# Patient Record
Sex: Male | Born: 1935 | Race: White | Hispanic: No | Marital: Single
Health system: Southern US, Community
[De-identification: ages and names within clinical notes are randomized; demographics above are authoritative.]

## PROBLEM LIST (undated history)

## (undated) DIAGNOSIS — I10 Essential (primary) hypertension: Secondary | ICD-10-CM

## (undated) DIAGNOSIS — N400 Enlarged prostate without lower urinary tract symptoms: Secondary | ICD-10-CM

## (undated) DIAGNOSIS — M1990 Unspecified osteoarthritis, unspecified site: Secondary | ICD-10-CM

## (undated) DIAGNOSIS — G20 Parkinson's disease: Secondary | ICD-10-CM

## (undated) DIAGNOSIS — F32A Depression, unspecified: Secondary | ICD-10-CM

## (undated) DIAGNOSIS — E039 Hypothyroidism, unspecified: Secondary | ICD-10-CM

## (undated) DIAGNOSIS — K219 Gastro-esophageal reflux disease without esophagitis: Secondary | ICD-10-CM

## (undated) DIAGNOSIS — F0390 Unspecified dementia without behavioral disturbance: Secondary | ICD-10-CM

## (undated) DIAGNOSIS — F329 Major depressive disorder, single episode, unspecified: Secondary | ICD-10-CM

## (undated) DIAGNOSIS — I509 Heart failure, unspecified: Secondary | ICD-10-CM

## (undated) DIAGNOSIS — J449 Chronic obstructive pulmonary disease, unspecified: Secondary | ICD-10-CM

---

## 2007-05-29 ENCOUNTER — Other Ambulatory Visit: Payer: Self-pay

## 2007-05-29 ENCOUNTER — Inpatient Hospital Stay: Payer: Self-pay | Admitting: Surgery

## 2007-09-04 ENCOUNTER — Other Ambulatory Visit: Payer: Self-pay

## 2007-09-04 ENCOUNTER — Ambulatory Visit: Payer: Self-pay | Admitting: Surgery

## 2007-09-11 ENCOUNTER — Ambulatory Visit: Payer: Self-pay | Admitting: Surgery

## 2007-09-12 ENCOUNTER — Inpatient Hospital Stay: Payer: Self-pay | Admitting: Surgery

## 2008-01-22 ENCOUNTER — Ambulatory Visit: Payer: Self-pay | Admitting: Internal Medicine

## 2008-12-27 IMAGING — CR PELVIS - 1-2 VIEW
1 series · 1 of 1 positions shown · non-contrast
Comparison: none

REASON FOR EXAM: Fall
COMMENTS:

PROCEDURE:     DXR - DXR PELVIS AP ONLY  - May 29, 2007  [DATE]
RESULT:     There is marked distention of visualized bowel loops. There are
findings that could reflect a sigmoid volvulus. I do not see acute bony
abnormalities.

[view not recorded]
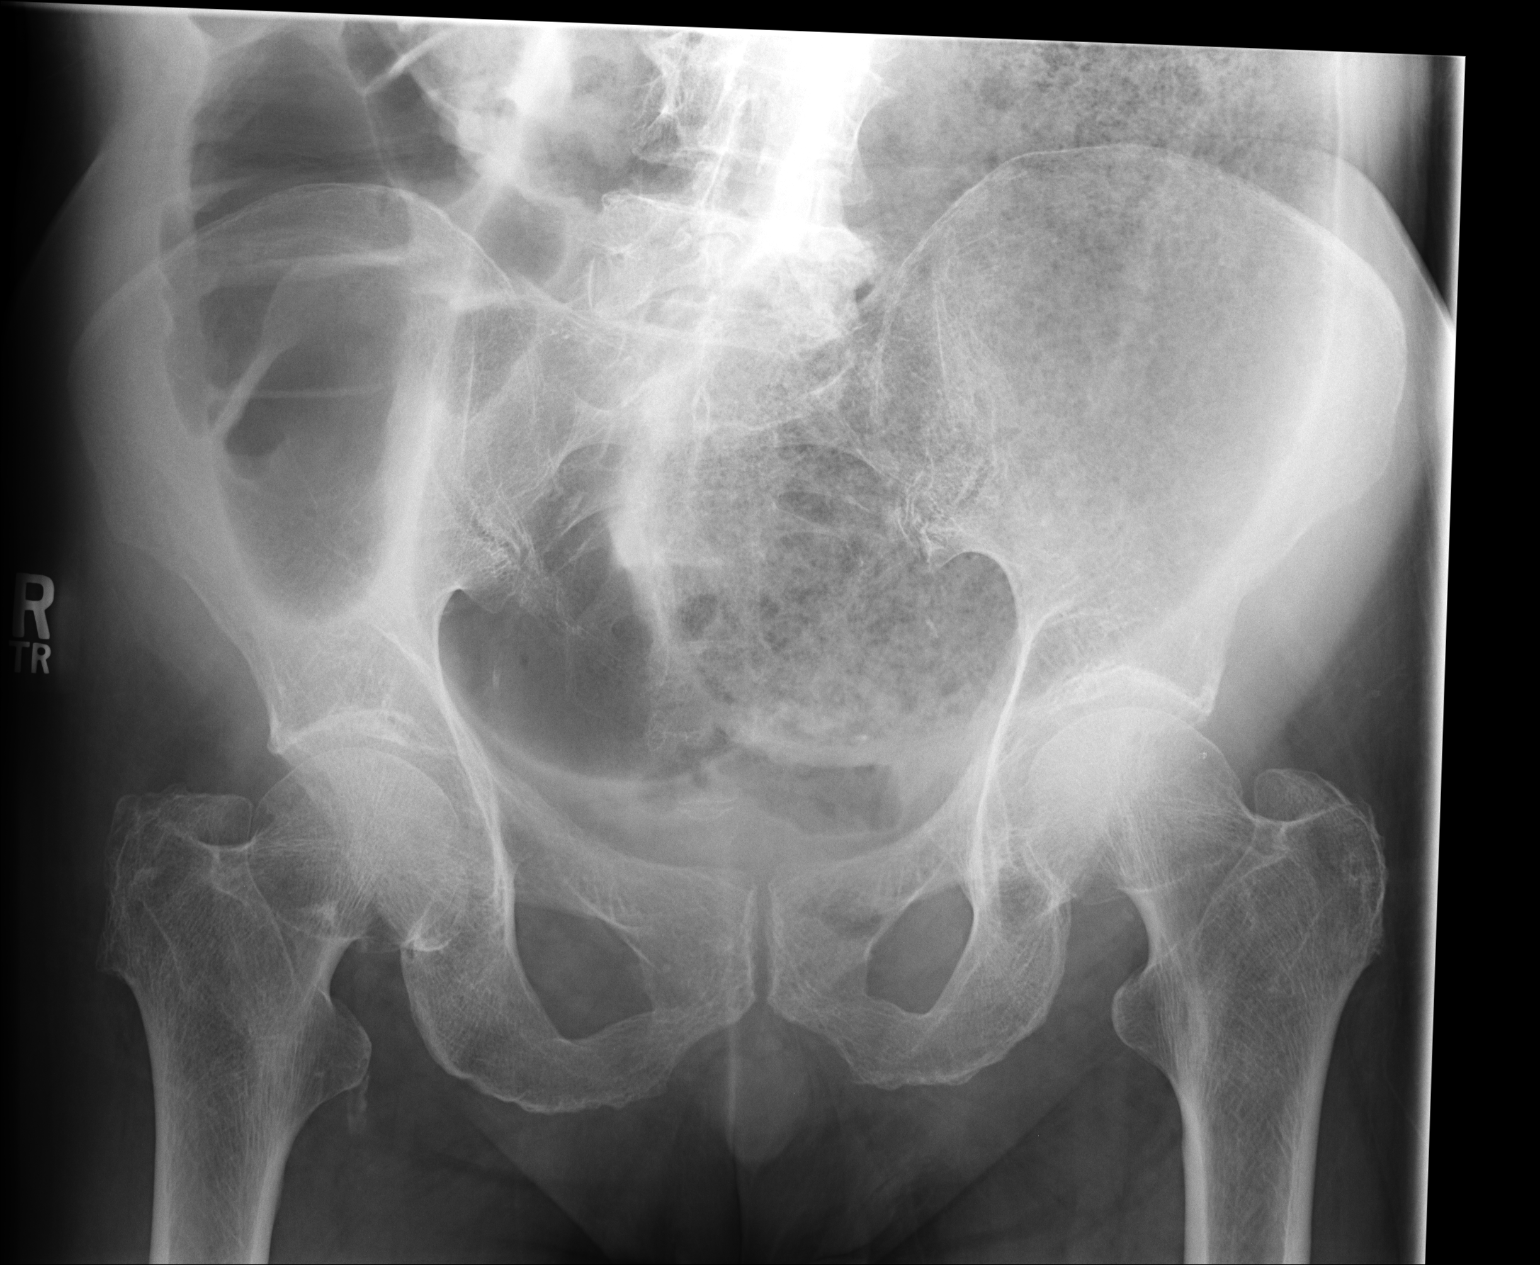

[1 of 1 positions shown; findings below may reference images not displayed]

IMPRESSION: I do not see objective evidence of a fracture of the bony
pelvis. A large amount of distended bowel is present in the pelvis. The
patient has undergone recent CT scanning.

## 2009-07-22 ENCOUNTER — Ambulatory Visit: Payer: Self-pay | Admitting: Unknown Physician Specialty

## 2009-07-30 ENCOUNTER — Emergency Department: Payer: Self-pay | Admitting: Unknown Physician Specialty

## 2009-08-01 ENCOUNTER — Emergency Department: Payer: Self-pay | Admitting: Unknown Physician Specialty

## 2009-10-12 ENCOUNTER — Inpatient Hospital Stay: Payer: Self-pay | Admitting: Internal Medicine

## 2009-12-26 ENCOUNTER — Observation Stay: Payer: Self-pay | Admitting: Internal Medicine

## 2009-12-27 ENCOUNTER — Ambulatory Visit: Payer: Self-pay | Admitting: Geriatric Medicine

## 2009-12-31 ENCOUNTER — Inpatient Hospital Stay: Payer: Self-pay | Admitting: Internal Medicine

## 2010-03-13 ENCOUNTER — Emergency Department: Payer: Self-pay | Admitting: Emergency Medicine

## 2010-03-16 ENCOUNTER — Inpatient Hospital Stay: Payer: Self-pay | Admitting: Specialist

## 2010-10-17 ENCOUNTER — Emergency Department: Payer: Self-pay | Admitting: Emergency Medicine

## 2010-12-01 ENCOUNTER — Ambulatory Visit: Payer: Self-pay | Admitting: Geriatric Medicine

## 2010-12-06 ENCOUNTER — Other Ambulatory Visit: Payer: Self-pay | Admitting: Geriatric Medicine

## 2011-05-16 IMAGING — CR DG ABDOMEN 2V
1 series · 2 of 2 positions shown · non-contrast
Comparison: none

REASON FOR EXAM: ileus
COMMENTS:   Bedside (portable):Y

[Series 1: view not recorded · 0.17mm/px · 2 of 2 slices shown]
[im 1/2]
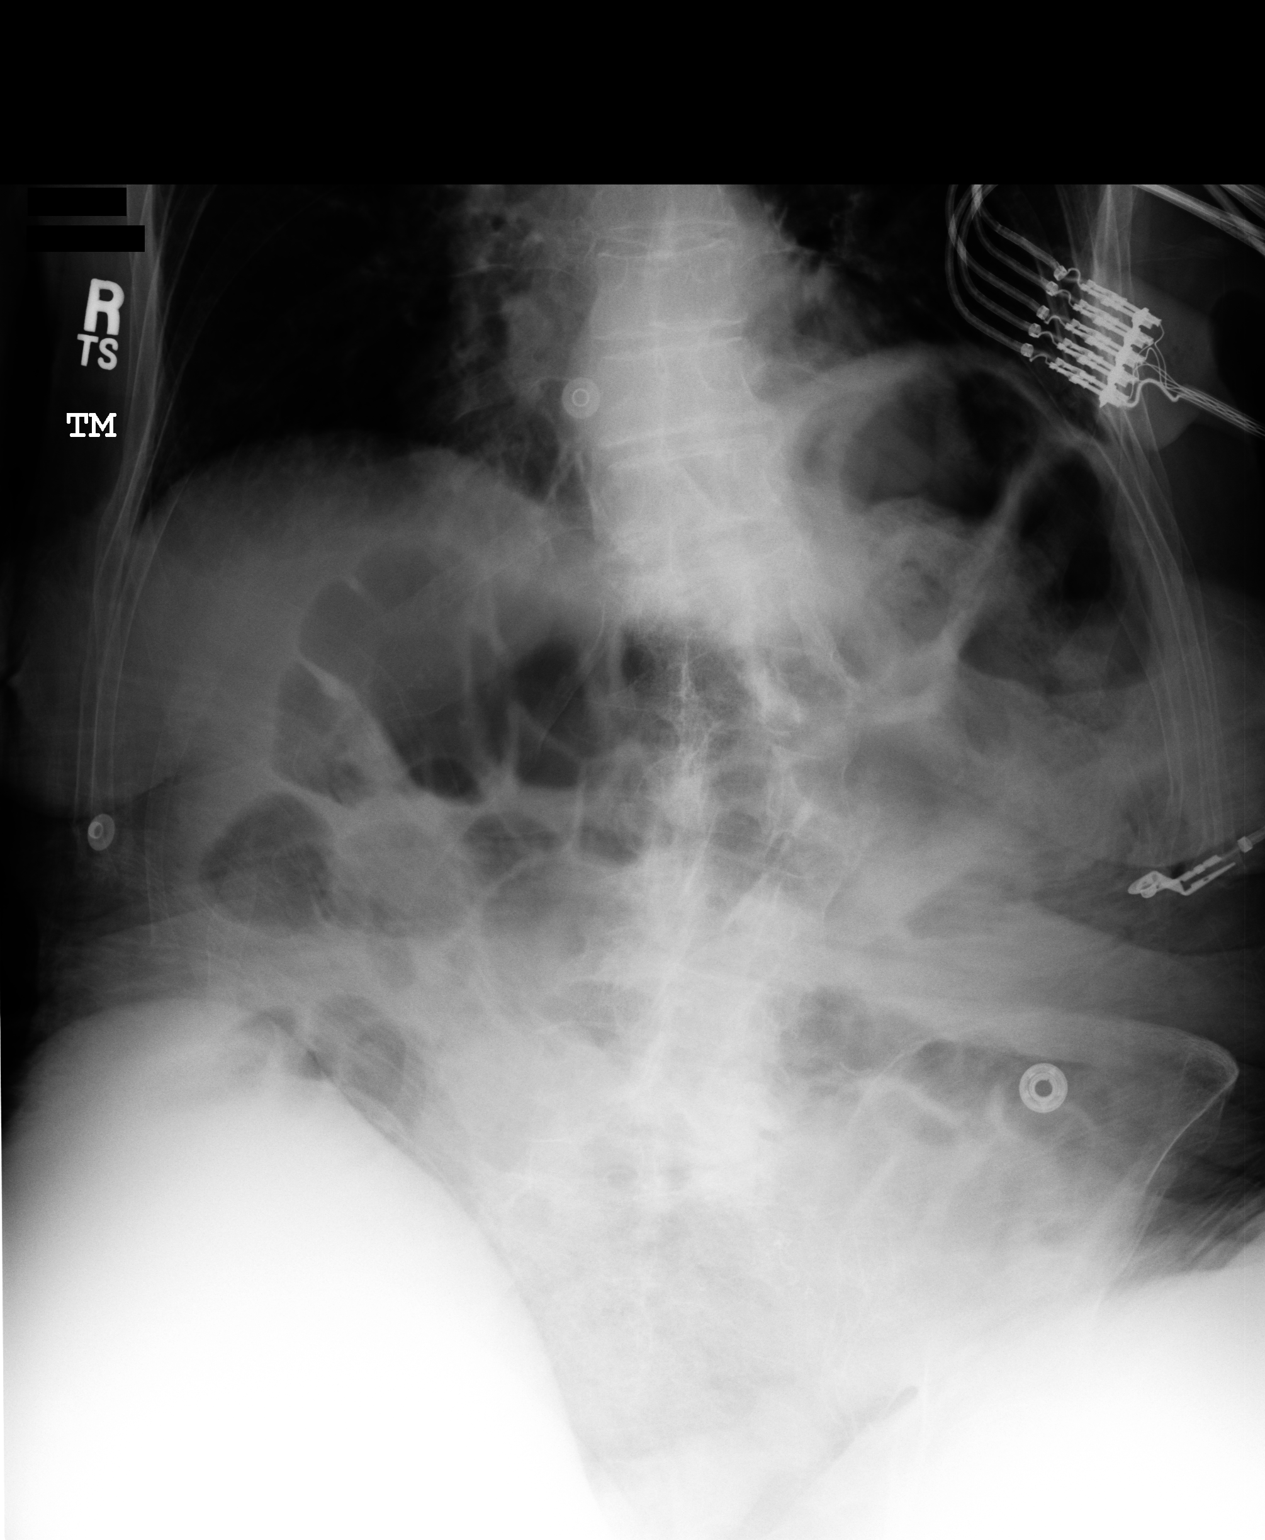
[im 2/2]
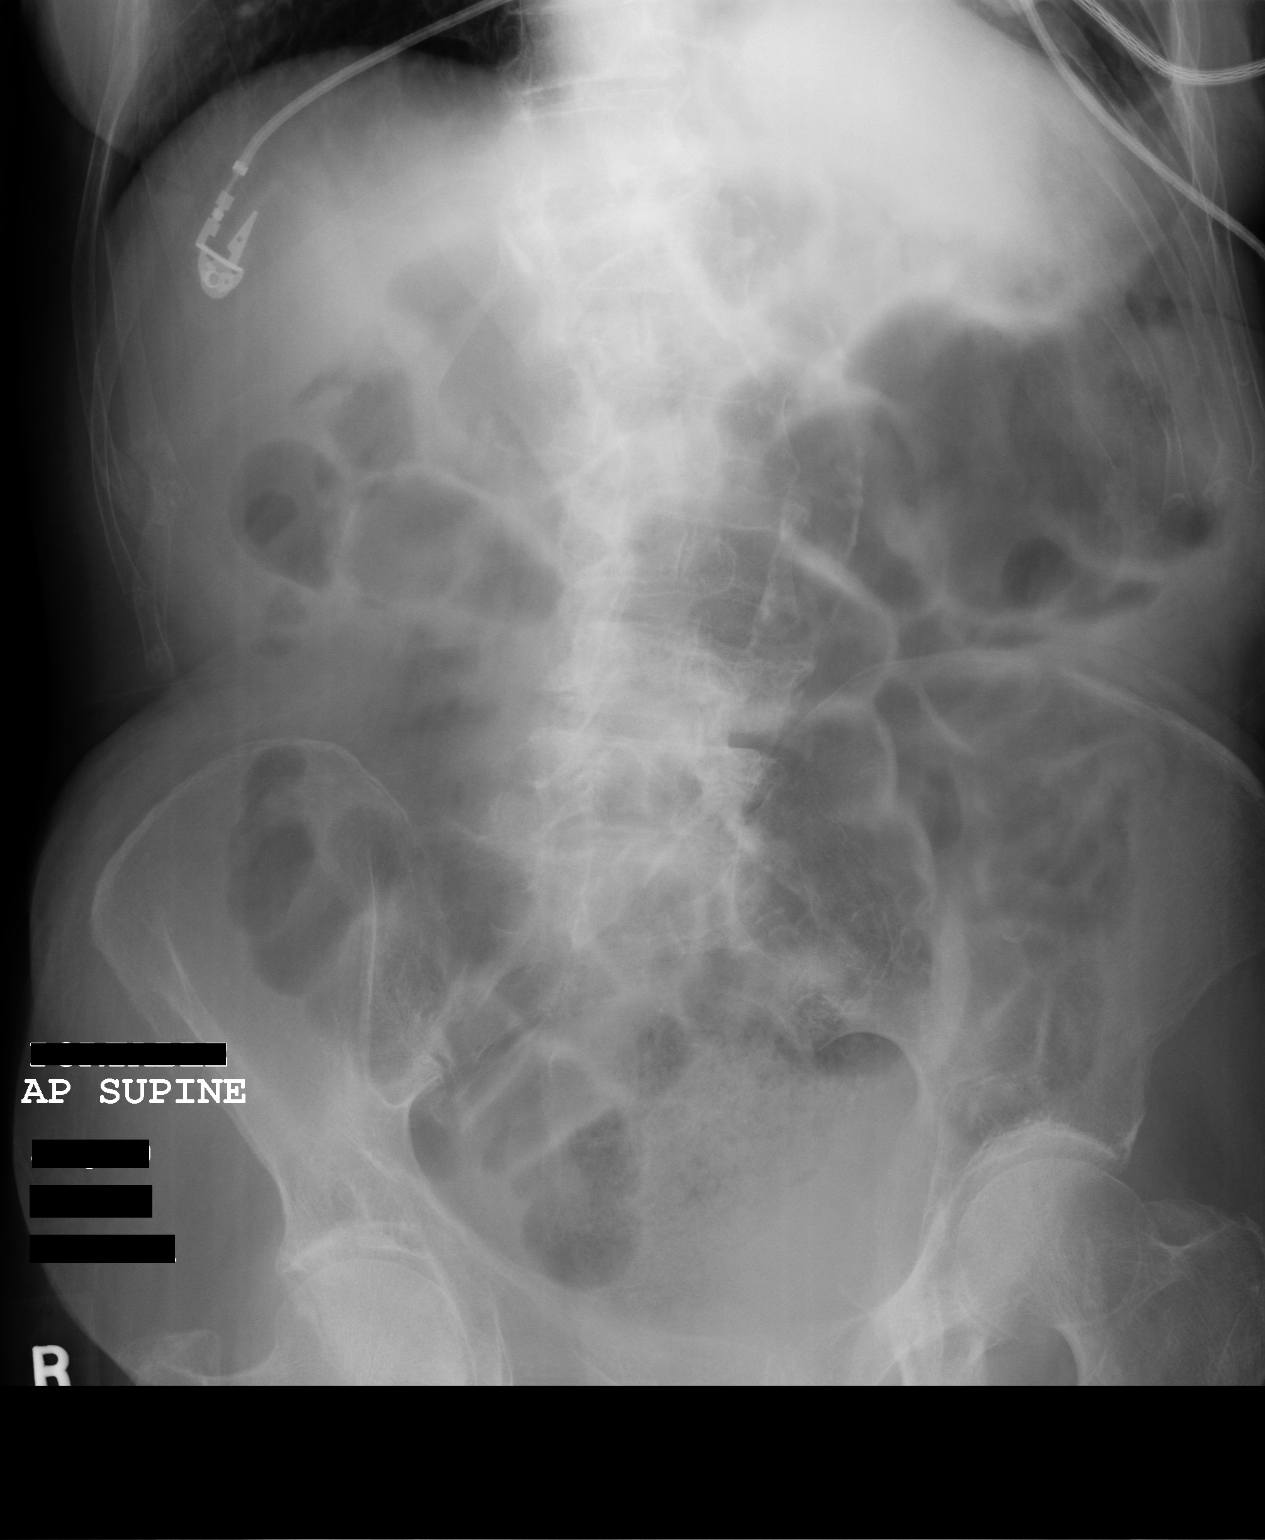

[2 of 2 positions shown; findings below may reference images not displayed]

PROCEDURE:     DXR - DXR ABDOMEN 2 V FLAT AND ERECT  - October 15, 2009  [DATE]

RESULT:     Supine and upright abdominal films reveal a moderate amount of
gas within small and large bowel loops. The pattern is not clearly
obstructive. There is stool in the region of the rectosigmoid. There is
curvature of the lumbar spine with convexity toward the right. There is
calcification in the wall of the abdominal aorta.
IMPRESSION: The bowel gas pattern is relatively nonspecific. The volume
fecal impaction in the appropriate clinical setting given the volume of
stool in the rectosigmoid.

## 2011-05-16 IMAGING — NM NUCLEAR MEDICINE GASTROINTESTINAL BLEEDING STUDY
2 series · 8 of 8 positions shown · non-contrast
Comparison: none

REASON FOR EXAM: gi bleed--lower
COMMENTS:

PROCEDURE:     NM  - NM GI BLOOD LOSS STUDY  - October 15, 2009 [DATE]
RESULT:
TECHNIQUE: Delaying 22.5 hour imaging of the abdomen and pelvis was
obtained. This is status post previous injection of technetium 99m labeled
PYP.

[Series 1000: gi bleed static · 2.40mm/px · 2 of 2 frames shown]
[frame 1/2]
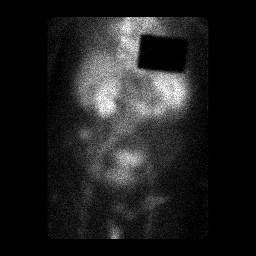
[frame 2/2]
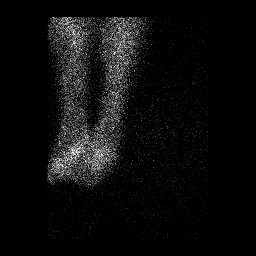

[Series 1000: gi bleed · 4.80mm/px · 6 of 250 frames shown]
[frame 21/250  full-range]
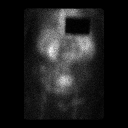
[frame 63/250  full-range]
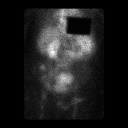
[frame 105/250  full-range]
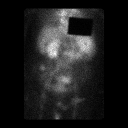
[frame 146/250  full-range]
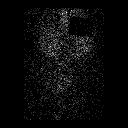
[frame 188/250]
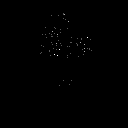
[frame 230/250]
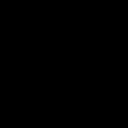

[8 of 8 positions shown; findings below may reference images not displayed]

FINDINGS: An area of increased radiotracer activity projects in the region
of the cecum/ascending colon. This area is not appreciated on the previous
study and is concerning for a region of hemorrhage.
IMPRESSION: Findings concerning for an area of hemorrhage in the region
of the cecum/ascending colon.

Thank you for the opportunity to contribute to the care of your patient.

ADDENDUM:

Dosage administered was 24.4 mCi of technetium-44m labelled 3 ml of PYP.

## 2011-06-03 ENCOUNTER — Ambulatory Visit: Payer: Self-pay | Admitting: Geriatric Medicine

## 2011-06-22 ENCOUNTER — Ambulatory Visit: Payer: Self-pay | Admitting: Oncology

## 2011-06-25 ENCOUNTER — Ambulatory Visit: Payer: Self-pay | Admitting: Oncology

## 2011-07-16 ENCOUNTER — Ambulatory Visit: Payer: Self-pay | Admitting: Oncology

## 2015-11-02 ENCOUNTER — Inpatient Hospital Stay
Admission: EM | Admit: 2015-11-02 | Discharge: 2015-11-13 | DRG: 378 | Disposition: E | Payer: Medicare Other | Attending: Internal Medicine | Admitting: Internal Medicine

## 2015-11-02 ENCOUNTER — Encounter: Payer: Self-pay | Admitting: Internal Medicine

## 2015-11-02 DIAGNOSIS — R578 Other shock: Secondary | ICD-10-CM | POA: Diagnosis present

## 2015-11-02 DIAGNOSIS — Z7401 Bed confinement status: Secondary | ICD-10-CM | POA: Diagnosis not present

## 2015-11-02 DIAGNOSIS — I959 Hypotension, unspecified: Secondary | ICD-10-CM | POA: Diagnosis present

## 2015-11-02 DIAGNOSIS — D649 Anemia, unspecified: Secondary | ICD-10-CM | POA: Diagnosis present

## 2015-11-02 DIAGNOSIS — F0390 Unspecified dementia without behavioral disturbance: Secondary | ICD-10-CM | POA: Diagnosis present

## 2015-11-02 DIAGNOSIS — K921 Melena: Secondary | ICD-10-CM | POA: Diagnosis present

## 2015-11-02 DIAGNOSIS — K922 Gastrointestinal hemorrhage, unspecified: Secondary | ICD-10-CM

## 2015-11-02 DIAGNOSIS — G20 Parkinson's disease: Secondary | ICD-10-CM | POA: Diagnosis present

## 2015-11-02 DIAGNOSIS — I10 Essential (primary) hypertension: Secondary | ICD-10-CM | POA: Diagnosis present

## 2015-11-02 DIAGNOSIS — Z79899 Other long term (current) drug therapy: Secondary | ICD-10-CM

## 2015-11-02 DIAGNOSIS — E039 Hypothyroidism, unspecified: Secondary | ICD-10-CM | POA: Diagnosis present

## 2015-11-02 DIAGNOSIS — Z66 Do not resuscitate: Secondary | ICD-10-CM | POA: Diagnosis present

## 2015-11-02 DIAGNOSIS — J449 Chronic obstructive pulmonary disease, unspecified: Secondary | ICD-10-CM | POA: Diagnosis present

## 2015-11-02 DIAGNOSIS — Z515 Encounter for palliative care: Secondary | ICD-10-CM | POA: Diagnosis present

## 2015-11-02 DIAGNOSIS — R710 Precipitous drop in hematocrit: Secondary | ICD-10-CM | POA: Diagnosis present

## 2015-11-02 DIAGNOSIS — F329 Major depressive disorder, single episode, unspecified: Secondary | ICD-10-CM | POA: Diagnosis present

## 2015-11-02 DIAGNOSIS — N400 Enlarged prostate without lower urinary tract symptoms: Secondary | ICD-10-CM | POA: Diagnosis present

## 2015-11-02 HISTORY — DX: Benign prostatic hyperplasia without lower urinary tract symptoms: N40.0

## 2015-11-02 HISTORY — DX: Unspecified osteoarthritis, unspecified site: M19.90

## 2015-11-02 HISTORY — DX: Unspecified dementia, unspecified severity, without behavioral disturbance, psychotic disturbance, mood disturbance, and anxiety: F03.90

## 2015-11-02 HISTORY — DX: Essential (primary) hypertension: I10

## 2015-11-02 HISTORY — DX: Major depressive disorder, single episode, unspecified: F32.9

## 2015-11-02 HISTORY — DX: Depression, unspecified: F32.A

## 2015-11-02 HISTORY — DX: Hypothyroidism, unspecified: E03.9

## 2015-11-02 HISTORY — DX: Chronic obstructive pulmonary disease, unspecified: J44.9

## 2015-11-02 HISTORY — DX: Heart failure, unspecified: I50.9

## 2015-11-02 HISTORY — DX: Parkinson's disease: G20

## 2015-11-02 HISTORY — DX: Gastro-esophageal reflux disease without esophagitis: K21.9

## 2015-11-02 LAB — COMPREHENSIVE METABOLIC PANEL
ALK PHOS: 58 U/L (ref 38–126)
ALT: 10 U/L — AB (ref 17–63)
AST: 19 U/L (ref 15–41)
Albumin: 3.2 g/dL — ABNORMAL LOW (ref 3.5–5.0)
Anion gap: 7 (ref 5–15)
BILIRUBIN TOTAL: 0.2 mg/dL — AB (ref 0.3–1.2)
BUN: 31 mg/dL — AB (ref 6–20)
CO2: 24 mmol/L (ref 22–32)
CREATININE: 0.57 mg/dL — AB (ref 0.61–1.24)
Calcium: 9 mg/dL (ref 8.9–10.3)
Chloride: 112 mmol/L — ABNORMAL HIGH (ref 101–111)
GFR calc Af Amer: >60 mL/min (ref >60)
GLUCOSE: 149 mg/dL — AB (ref 65–99)
POTASSIUM: 4 mmol/L (ref 3.5–5.1)
Sodium: 143 mmol/L (ref 135–145)
TOTAL PROTEIN: 5.7 g/dL — AB (ref 6.5–8.1)

## 2015-11-02 LAB — CBC
HEMATOCRIT: 23.9 % — AB (ref 40.0–52.0)
Hemoglobin: 7.6 g/dL — ABNORMAL LOW (ref 13.0–18.0)
MCH: 32.7 pg (ref 26.0–34.0)
MCHC: 31.6 g/dL — AB (ref 32.0–36.0)
MCV: 103.6 fL — AB (ref 80.0–100.0)
PLATELETS: 154 10*3/uL (ref 150–440)
RBC: 2.31 MIL/uL — ABNORMAL LOW (ref 4.40–5.90)
RDW: 17.9 % — AB (ref 11.5–14.5)
WBC: 10.4 10*3/uL (ref 3.8–10.6)

## 2015-11-02 LAB — PROTIME-INR
INR: 1.09
Prothrombin Time: 14.3 seconds (ref 11.4–15.0)

## 2015-11-02 LAB — PREPARE RBC (CROSSMATCH)

## 2015-11-02 LAB — ABO/RH: ABO/RH(D): O POS

## 2015-11-02 MED ORDER — ONDANSETRON HCL 4 MG PO TABS: 4.0000 mg | ORAL_TABLET | Freq: Four times a day (QID) | ORAL | Status: DC | PRN

## 2015-11-02 MED ORDER — PANTOPRAZOLE SODIUM 40 MG IV SOLR
40.0000 mg | Freq: Once | INTRAVENOUS | Status: AC
  Administered 2015-11-02: 40 mg via INTRAVENOUS
  Filled 2015-11-02: qty 40

## 2015-11-02 MED ORDER — SODIUM CHLORIDE 0.9 % IV SOLN
10.0000 mL/h | Freq: Once | INTRAVENOUS | Status: AC
  Administered 2015-11-02: 10 mL/h via INTRAVENOUS

## 2015-11-02 MED ORDER — LAMOTRIGINE 100 MG PO TABS
50.0000 mg | ORAL_TABLET | Freq: Every day | ORAL | Status: DC
  Administered 2015-11-03: 50 mg via ORAL
  Filled 2015-11-02: qty 1

## 2015-11-02 MED ORDER — QUETIAPINE FUMARATE 25 MG PO TABS
25.0000 mg | ORAL_TABLET | Freq: Two times a day (BID) | ORAL | Status: DC
  Administered 2015-11-03 (×3): 25 mg via ORAL
  Filled 2015-11-02 (×3): qty 1

## 2015-11-02 MED ORDER — PANTOPRAZOLE SODIUM 40 MG IV SOLR: 40.0000 mg | Freq: Two times a day (BID) | INTRAVENOUS | Status: DC

## 2015-11-02 MED ORDER — TAMSULOSIN HCL 0.4 MG PO CAPS
0.4000 mg | ORAL_CAPSULE | Freq: Every day | ORAL | Status: DC
  Administered 2015-11-03: 0.4 mg via ORAL
  Filled 2015-11-02: qty 1

## 2015-11-02 MED ORDER — SODIUM CHLORIDE 0.9 % IV SOLN
INTRAVENOUS | Status: DC
  Administered 2015-11-02: via INTRAVENOUS

## 2015-11-02 MED ORDER — SODIUM CHLORIDE 0.9 % IV SOLN
8.0000 mg/h | INTRAVENOUS | Status: DC
  Administered 2015-11-03 (×2): 8 mg/h via INTRAVENOUS
  Filled 2015-11-02 (×2): qty 80

## 2015-11-02 MED ORDER — SODIUM CHLORIDE 0.9 % IV SOLN
80.0000 mg | Freq: Once | INTRAVENOUS | Status: AC
  Administered 2015-11-03: 80 mg via INTRAVENOUS
  Filled 2015-11-02: qty 80

## 2015-11-02 MED ORDER — ONDANSETRON HCL 4 MG/2ML IJ SOLN: 4.0000 mg | Freq: Four times a day (QID) | INTRAMUSCULAR | Status: DC | PRN

## 2015-11-02 MED ORDER — PAROXETINE HCL 20 MG PO TABS
10.0000 mg | ORAL_TABLET | Freq: Every day | ORAL | Status: DC
Start: 2015-11-03 — End: 2015-11-03
  Administered 2015-11-03: 10 mg via ORAL
  Filled 2015-11-02: qty 1

## 2015-11-02 MED ORDER — CARBIDOPA-LEVODOPA 10-100 MG PO TABS
1.0000 | ORAL_TABLET | Freq: Two times a day (BID) | ORAL | Status: DC
  Administered 2015-11-03: 1 via ORAL
  Filled 2015-11-02 (×3): qty 1

## 2015-11-02 MED ORDER — ACETAMINOPHEN 325 MG PO TABS: 650.0000 mg | ORAL_TABLET | Freq: Four times a day (QID) | ORAL | Status: DC | PRN

## 2015-11-02 MED ORDER — SODIUM CHLORIDE 0.9% FLUSH
3.0000 mL | Freq: Two times a day (BID) | INTRAVENOUS | Status: DC
  Administered 2015-11-02 – 2015-11-03 (×2): 3 mL via INTRAVENOUS

## 2015-11-02 MED ORDER — LEVOTHYROXINE SODIUM 88 MCG PO TABS
88.0000 ug | ORAL_TABLET | Freq: Every day | ORAL | Status: DC
  Administered 2015-11-03: 88 ug via ORAL
  Filled 2015-11-02: qty 1

## 2015-11-02 MED ORDER — ACETAMINOPHEN 650 MG RE SUPP
650.0000 mg | Freq: Four times a day (QID) | RECTAL | Status: DC | PRN
Start: 2015-11-02 — End: 2015-11-04

## 2015-11-02 NOTE — ED Notes (Signed)
Pt presents frfom nursing home after 'several' liquid BM'S that were dark and foul smelling. Pt arrives w/ similar BM. Pt is A&O x 1 and in no acute distress at this time. PT responds to verbal and answers questions, but perseverates on answers somewhat.

## 2015-11-02 NOTE — H&P (Signed)
[facility]   PATIENT NAME: Lee Lam    MR#:  [id number]  DATE OF BIRTH:  28-Feb-1924  DATE OF ADMISSION:  05/15/16  PRIMARY CARE PHYSICIAN: No primary care provider on file.   REQUESTING/REFERRING PHYSICIAN: Mayford KnifeWilliams, MD  CHIEF COMPLAINT:   Chief Complaint  Patient presents with  . GI Bleeding    HISTORY OF PRESENT ILLNESS:  Lee Goodward Lickteig  is a 82 y.o. male who presents with GI bleed. Patient is unable to contribute any significant information to his history of present illness due to dementia. He comes from a nursing facility with DO NOT RESUSCITATE form, and not much more information. He apparently is a ward of the state, without family members to contact. He had recurrent bloody bowel movement in the ED, though he is hemodynamically stable. Blood was ordered in the ED and hospitalists were called for admission.  PAST MEDICAL HISTORY:   Past Medical History  Diagnosis Date  . Dementia   . Hypothyroidism   . BPH (benign prostatic hyperplasia)   . Depression     PAST SURGICAL HISTORY:  No past surgical history on file.  Patient is unable to provide this information due to dementia SOCIAL HISTORY:   Social History  Substance Use Topics  . Smoking status: Unknown If Ever Smoked  . Smokeless tobacco: Not on file  . Alcohol Use: No    FAMILY HISTORY:  No family history on file.  Patient is unable to provide this information due to dementia DRUG ALLERGIES:  No Known Allergies  MEDICATIONS AT HOME:   Prior to Admission medications   Medication Sig Start Date End Date Taking? Authorizing Provider  acetaminophen (TYLENOL) 325 MG tablet Take 1,300 mg by mouth 2 (two) times daily.   Yes Historical Provider, MD  albuterol (PROVENTIL HFA;VENTOLIN HFA) 108 (90 Base) MCG/ACT inhaler Inhale 1 puff into the lungs 2 (two) times daily. With spacer   Yes Historical Provider, MD  carbidopa-levodopa (SINEMET IR) 10-100 MG  tablet Take 1 tablet by mouth 2 (two) times daily. Before breakfast and lunch   Yes Historical Provider, MD  cholecalciferol (VITAMIN D) 1000 units tablet Take 2,000 Units by mouth daily.   Yes Historical Provider, MD  cyanocobalamin (,VITAMIN B-12,) 1000 MCG/ML injection Inject 1,000 mcg into the muscle every 30 (thirty) days.   Yes Historical Provider, MD  ferrous sulfate 325 (65 FE) MG tablet Take 325 mg by mouth daily.   Yes Historical Provider, MD  folic acid (FOLVITE) 800 MCG tablet Take 800 mcg by mouth daily.   Yes Historical Provider, MD  furosemide (LASIX) 20 MG tablet Take 10 mg by mouth daily.   Yes Historical Provider, MD  lamoTRIgine (LAMICTAL) 25 MG tablet Take 50 mg by mouth daily.   Yes Historical Provider, MD  levothyroxine (SYNTHROID, LEVOTHROID) 88 MCG tablet Take 88 mcg by mouth daily before breakfast.   Yes Historical Provider, MD  metroNIDAZOLE (METROCREAM) 0.75 % cream Apply 1 application topically daily. For rosacea   Yes Historical Provider, MD  PARoxetine (PAXIL) 10 MG tablet Take 10 mg by mouth daily.   Yes Historical Provider, MD  polyethylene glycol (MIRALAX / GLYCOLAX) packet Take 17 g by mouth daily.   Yes Historical Provider, MD  QUEtiapine (SEROQUEL) 25 MG tablet Take 25 mg by mouth 2 (two) times daily.   Yes Historical Provider, MD  tamsulosin (FLOMAX) 0.4 MG CAPS capsule Take 0.4 mg by mouth at bedtime.   Yes Historical  Provider, MD    REVIEW OF SYSTEMS:  Review of Systems  Unable to perform ROS: dementia     VITAL SIGNS:   Filed Vitals:   11-21-15 2046 2015-11-21 2100 11-21-2015 2130 21-Nov-2015 2230  BP: 92/50 100/55 103/55 98/46  Pulse: 86 84 76 78  Temp: 98 F (36.7 C)   97.7 F (36.5 C)  TempSrc: Oral   Oral  Resp: 20 34  23  Weight: 65.772 kg (145 lb)     SpO2: 95% 96% 99% 95%   Wt Readings from Last 3 Encounters:  Nov 21, 2015 65.772 kg (145 lb)    PHYSICAL EXAMINATION:  Physical Exam  Vitals reviewed. Constitutional: He appears well-developed  and well-nourished. No distress.  HENT:  Head: Normocephalic and atraumatic.  Mouth/Throat: Oropharynx is clear and moist.  Eyes: Conjunctivae and EOM are normal. Pupils are equal, round, and reactive to light. No scleral icterus.  Neck: Normal range of motion. Neck supple. No JVD present. No thyromegaly present.  Cardiovascular: Normal rate, regular rhythm and intact distal pulses.  Exam reveals no gallop and no friction rub.   No murmur heard. Respiratory: Effort normal and breath sounds normal. No respiratory distress. He has no wheezes. He has no rales.  GI: Soft. Bowel sounds are normal. He exhibits no distension. There is no tenderness.  Musculoskeletal: Normal range of motion. He exhibits no edema.  No arthritis, no gout  Lymphadenopathy:    He has no cervical adenopathy.  Neurological: He is alert. No cranial nerve deficit.  Patient is oriented to person only. No dysarthria, no aphasia  Skin: Skin is warm and dry. No rash noted. No erythema.  Psychiatric:  Unable to assess due to dementia    LABORATORY PANEL:   CBC  Recent Labs Lab 21-Nov-2015 2109  WBC 10.4  HGB 7.6*  HCT 23.9*  PLT 154   ------------------------------------------------------------------------------------------------------------------  Chemistries   Recent Labs Lab 11/21/15 2109  NA 143  K 4.0  CL 112*  CO2 24  GLUCOSE 149*  BUN 31*  CREATININE 0.57*  CALCIUM 9.0  AST 19  ALT 10*  ALKPHOS 58  BILITOT 0.2*   ------------------------------------------------------------------------------------------------------------------  Cardiac Enzymes No results for input(s): TROPONINI in the last 168 hours. ------------------------------------------------------------------------------------------------------------------  RADIOLOGY:  No results found.  EKG:   Orders placed or performed during the hospital encounter of 11/21/2015  . EKG 12-Lead  . EKG 12-Lead    IMPRESSION AND PLAN:   Principal Problem:   GI bleed - Hemodynamically stable, hemoglobin was low, blood transfusion ordered in the ED. We will admit him with IV Protonix, monitor his hemoglobin every 6 hours for now, transfuse as needed for hemoglobin less than 7 or with major bleeding, get GI consult. Active Problems:   Dementia - continue home meds, patient is currently able to converse, though he is confused and oriented only to person. Unclear what his baseline is, though suspect he is somewhere near his baseline at this time.   BPH (benign prostatic hyperplasia) - continue home meds   Hypothyroidism - home dose thyroid replacement  All the records are reviewed and case discussed with ED provider. Management plans discussed with the patient and/or family.  DVT PROPHYLAXIS: Mechanical only  GI PROPHYLAXIS: PPI  ADMISSION STATUS: Inpatient  CODE STATUS: Full Code Status History    This patient does not have a recorded code status. Please follow your organizational policy for patients in this situation.    Advance Directive Documentation  Most Recent Value   Type of Advance Directive  Out of facility DNR (pink MOST or yellow form)   Pre-existing out of facility DNR order (yellow form or pink MOST form)  Yellow form placed in chart (order not valid for inpatient use)   "MOST" Form in Place?        TOTAL TIME TAKING CARE OF THIS PATIENT: 45 minutes.    Ernan Runkles FIELDING 10/28/2015, 10:47 PM  [facility]  Office  [phone]  CC: Primary care physician; No primary care provider on file.

## 2015-11-02 NOTE — ED Notes (Signed)
Pt cleaned of large amount thick liquid dark stool;

## 2015-11-02 NOTE — ED Provider Notes (Signed)
[facility] Emergency Department Provider Note      L5 caveat: Review of systems and history is limited by dementia  Time seen: ----------------------------------------- 9:01 PM on 10/25/2015 -----------------------------------------    I have reviewed the triage vital signs and the nursing notes.   HISTORY  Chief Complaint GI Bleeding    HPI Lee Lam is a 82 y.o. male brought to the ER for rectal bleeding. Patient comes from a nursing home after several liquid bowel movements that were dark and foul-smelling. Patient arrives with similar movement. Patient responds to verbal questioning but perseverates and is obviously demented. According to report he doesn't history of GI bleeding.   No past medical history on file.  There are no active problems to display for this patient.   No past surgical history on file.  Allergies Review of patient's allergies indicates not on file.  Social History Social History  Substance Use Topics  . Smoking status: Not on file  . Smokeless tobacco: Not on file  . Alcohol Use: Not on file    Review of Systems Unknown, positive for GI bleeding  ____________________________________________   PHYSICAL EXAM:  VITAL SIGNS: ED Triage Vitals  Enc Vitals Group     BP 11/05/2015 2046 92/50 mmHg     Pulse Rate 10/27/2015 2046 86     Resp 11/08/2015 2046 20     Temp 11/09/2015 2046 98 F (36.7 C)     Temp Source 10/28/2015 2046 Oral     SpO2 11/12/2015 2046 95 %     Weight 10/16/2015 2046 145 lb (65.772 kg)     Height --      Head Cir --      Peak Flow --      Pain Score --      Pain Loc --      Pain Edu? --      Excl. in GC? --     Constitutional: Generally ill-appearing, in no distress Eyes: Conjunctivae are pale PERRL. Normal extraocular movements. ENT   Head: Normocephalic and atraumatic.   Nose: No congestion/rhinnorhea.   Mouth/Throat: Mucous membranes are moist.   Neck: No  stridor. Cardiovascular: Normal rate, regular rhythm. No murmurs, rubs, or gallops. Respiratory: Normal respiratory effort without tachypnea nor retractions. Breath sounds are clear and equal bilaterally. No wheezes/rales/rhonchi. Gastrointestinal: Soft and nontender. Normal bowel sounds Rectal: Copious melanotic stool Musculoskeletal: Limited range of motion of the extremities. Minimal lower extremity edema Neurologic:  Normal speech and language. No gross focal neurologic deficits are appreciated.  Skin:  Skin is warm, dry and intact. Pallor is noted ____________________________________________  EKG: Interpreted by me. Normal sinus rhythm with a rate of 85 bpm, normal PR interval, wide QRS, normal QT interval. Right bundle branch block, left anterior fascicular block  ____________________________________________  ED COURSE:  Pertinent labs & imaging results that were available during my care of the patient were reviewed by me and considered in my medical decision making (see chart for details). Patient presents with melena and likely upper GI bleeding. I will order IV Protonix, type and screen and discussed with the family. Patient is DO NOT RESUSCITATE. ____________________________________________    LABS (pertinent positives/negatives)  Labs Reviewed  COMPREHENSIVE METABOLIC PANEL - Abnormal; Notable for the following:    Chloride 112 (*)    Glucose, Bld 149 (*)    BUN 31 (*)    Creatinine, Ser 0.57 (*)    Total Protein 5.7 (*)    Albumin 3.2 (*)  ALT 10 (*)    Total Bilirubin 0.2 (*)    All other components within normal limits  CBC - Abnormal; Notable for the following:    RBC 2.31 (*)    Hemoglobin 7.6 (*)    HCT 23.9 (*)    MCV 103.6 (*)    MCHC 31.6 (*)    RDW 17.9 (*)    All other components within normal limits  PROTIME-INR  POC OCCULT BLOOD, ED  TYPE AND SCREEN  PREPARE RBC (CROSSMATCH)  TYPE AND SCREEN    ____________________________________________  FINAL ASSESSMENT AND PLAN  Rectal bleeding, Anemia  Plan: Patient with labs and imaging as dictated above. Unclear etiology for his rectal bleeding. Patient having significant melena while in the room. I have ordered a blood transfusion. There is no family contact as he is a ward of the state. He remains a DO NOT RESUSCITATE. I'll recommend admission and GI consultation.   Lee Lam, Lee Tuch E, MD   Note: This dictation was prepared with Dragon dictation. Any transcriptional errors that result from this process are unintentional   Lee FilbertJonathan Lam Magdelena Kinsella, MD 24-Oct-2015 2221

## 2015-11-03 ENCOUNTER — Encounter: Payer: Self-pay | Admitting: *Deleted

## 2015-11-03 LAB — BASIC METABOLIC PANEL
Anion gap: 5 (ref 5–15)
BUN: 36 mg/dL — ABNORMAL HIGH (ref 6–20)
CALCIUM: 7.9 mg/dL — AB (ref 8.9–10.3)
CO2: 22 mmol/L (ref 22–32)
CREATININE: 0.59 mg/dL — AB (ref 0.61–1.24)
Chloride: 116 mmol/L — ABNORMAL HIGH (ref 101–111)
Glucose, Bld: 195 mg/dL — ABNORMAL HIGH (ref 65–99)
Potassium: 4 mmol/L (ref 3.5–5.1)
SODIUM: 143 mmol/L (ref 135–145)

## 2015-11-03 LAB — HEMOGLOBIN
HEMOGLOBIN: 6.4 g/dL — AB (ref 13.0–18.0)
Hemoglobin: 7.5 g/dL — ABNORMAL LOW (ref 13.0–18.0)

## 2015-11-03 LAB — HEMOGLOBIN AND HEMATOCRIT, BLOOD
HEMATOCRIT: 20.2 % — AB (ref 40.0–52.0)
HEMOGLOBIN: 6.6 g/dL — AB (ref 13.0–18.0)

## 2015-11-03 LAB — MRSA PCR SCREENING: MRSA BY PCR: NEGATIVE

## 2015-11-03 MED ORDER — SODIUM CHLORIDE 0.9 % IV SOLN
Freq: Once | INTRAVENOUS | Status: AC
  Administered 2015-11-03: 12:00:00 via INTRAVENOUS

## 2015-11-03 MED ORDER — MORPHINE SULFATE (CONCENTRATE) 10 MG/0.5ML PO SOLN: 10.0000 mg | ORAL | Status: DC | PRN

## 2015-11-03 MED ORDER — LORAZEPAM 2 MG/ML IJ SOLN: 0.5000 mg | Freq: Once | INTRAMUSCULAR | Status: DC

## 2015-11-03 MED ORDER — SODIUM CHLORIDE 0.9 % IV BOLUS (SEPSIS)
250.0000 mL | Freq: Once | INTRAVENOUS | Status: AC
  Administered 2015-11-03: 250 mL via INTRAVENOUS

## 2015-11-03 MED ORDER — SODIUM CHLORIDE 0.9 % IV BOLUS (SEPSIS)
500.0000 mL | Freq: Once | INTRAVENOUS | Status: AC
  Administered 2015-11-03: 500 mL via INTRAVENOUS

## 2015-11-03 MED ORDER — LORAZEPAM 2 MG/ML IJ SOLN: 0.5000 mg | INTRAMUSCULAR | Status: DC | PRN

## 2015-11-03 MED ORDER — CETYLPYRIDINIUM CHLORIDE 0.05 % MT LIQD
7.0000 mL | Freq: Two times a day (BID) | OROMUCOSAL | Status: DC
  Administered 2015-11-03 (×2): 7 mL via OROMUCOSAL

## 2015-11-03 NOTE — Progress Notes (Signed)
Called Dr. Sheryle Hailiamond regarding consent for blood transfusion.  Doctor told me to [facility] to see if there is paperwork showing consent for transfusion or I need to get consent from legal guardian, which is DSS because pt is a ward of the state.  [facility]ed [facility] and they gave me number of DSS office.  I called cell number of guardian- Lazaro ArmsKay Flack and left a message.  I also called DSS office of [place] and retrieved an on call number for after hours.  Gwen Rhina BrackettWilliams Jeffers put me in contact with the supervisor- Melynda KellerSusan Osborne, who gave me verbal consent for blood transfusion with two nurses as witnesses, including myself.  Arturo MortonClay, Jacobey Gura N  11/03/2015 5:59 AM

## 2015-11-03 NOTE — Progress Notes (Signed)
Notified Dr. Nemiah CommanderKalisetti of low BP and that patient had had a large bloody bowel movement. Will wait to see results of hemoglobin draw.

## 2015-11-03 NOTE — Clinical Social Work Note (Signed)
Dr. Nemiah CommanderKalisetti informed CSW that patient is not doing well and wanted to know the direct number for the DSS Guardian: Lazaro ArmsKay Flack: [phone]. [facility]. Full assessment to follow. York SpanielMonica Karan Inclan MSW,LCSW [phone]

## 2015-11-03 NOTE — NC FL2 (Signed)
[place] MEDICAID FL2 LEVEL OF CARE SCREENING TOOL     IDENTIFICATION  Patient Name: Lee Lam Birthdate: 09/30/1923 Sex: male Admission Date (Current Location): 10/30/2015  [place]   Facility and Address:  [facility], [address], [place], [place] [address]      Provider Number: [id number]  Attending Physician Name and Address:  Enid Baasadhika Kalisetti, MD  Relative Name and Phone Number:       Current Level of Care: Hospital Recommended Level of Care: Skilled Nursing Facility Prior Approval Number:    Date Approved/Denied:   PASRR Number:    Discharge Plan: SNF    Current Diagnoses: Patient Active Problem List   Diagnosis Date Noted  . GI bleed 10/22/2015  . Dementia 11/11/2015  . BPH (benign prostatic hyperplasia) 10/31/2015  . Hypothyroidism 10/31/2015    Orientation RESPIRATION BLADDER Height & Weight      (none)  O2 (4 liters O2) Incontinent Weight: 145 lb (65.772 kg) Height:     BEHAVIORAL SYMPTOMS/MOOD NEUROLOGICAL BOWEL NUTRITION STATUS   (none)  (none) Incontinent Diet (npo currently)  AMBULATORY STATUS COMMUNICATION OF NEEDS Skin   Total Care Verbally Normal                       Personal Care Assistance Level of Assistance  Total care       Total Care Assistance: Maximum assistance   Functional Limitations Info             SPECIAL CARE FACTORS FREQUENCY                       Contractures      Additional Factors Info  Code Status Code Status Info: DNR             Current Medications (11/03/2015):  This is the current hospital active medication list Current Facility-Administered Medications  Medication Dose Route Frequency Provider Last Rate Last Dose  . acetaminophen (TYLENOL) tablet 650 mg  650 mg Oral Q6H PRN Oralia Manisavid Willis, MD       Or  . acetaminophen (TYLENOL) suppository 650 mg  650 mg Rectal Q6H PRN Oralia Manisavid Willis, MD      . antiseptic oral rinse  (CPC / CETYLPYRIDINIUM CHLORIDE 0.05%) solution 7 mL  7 mL Mouth Rinse BID Oralia Manisavid Willis, MD   7 mL at 11/03/15 1000  . carbidopa-levodopa (SINEMET IR) 10-100 MG per tablet immediate release 1 tablet  1 tablet Oral BID WC Oralia Manisavid Willis, MD   1 tablet at 11/03/15 1024  . lamoTRIgine (LAMICTAL) tablet 50 mg  50 mg Oral Daily Oralia Manisavid Willis, MD   50 mg at 11/03/15 1200  . levothyroxine (SYNTHROID, LEVOTHROID) tablet 88 mcg  88 mcg Oral QAC breakfast Oralia Manisavid Willis, MD   88 mcg at 11/03/15 1024  . LORazepam (ATIVAN) injection 0.5 mg  0.5 mg Intravenous Once Enid Baasadhika Kalisetti, MD      . ondansetron [facility](ZOFRAN) tablet 4 mg  4 mg Oral Q6H PRN Oralia Manisavid Willis, MD       Or  . ondansetron [facility](ZOFRAN) injection 4 mg  4 mg Intravenous Q6H PRN Oralia Manisavid Willis, MD      . Melene Muller[START ON 11/06/2015] pantoprazole (PROTONIX) injection 40 mg  40 mg Intravenous Q12H Oralia Manisavid Willis, MD      . PARoxetine (PAXIL) tablet 10 mg  10 mg Oral Daily Oralia Manisavid Willis, MD   10 mg at 11/03/15 1200  .  QUEtiapine (SEROQUEL) tablet 25 mg  25 mg Oral BID Oralia Manis, MD   25 mg at 11/03/15 1200  . sodium chloride flush (NS) 0.9 % injection 3 mL  3 mL Intravenous Q12H Oralia Manis, MD   3 mL at 11/03/15 1000  . tamsulosin (FLOMAX) capsule 0.4 mg  0.4 mg Oral QHS Oralia Manis, MD   0.4 mg at 11/03/15 0047     Discharge Medications: Please see discharge summary for a list of discharge medications.  Relevant Imaging Results:  Relevant Lab Results:   Additional Information    York Spaniel, LCSW

## 2015-11-03 NOTE — Clinical Social Work Note (Signed)
MD has notified CSW that patient has been made comfort measures. York SpanielMonica Talyn Eddie MSW,LCSW [phone]

## 2015-11-03 NOTE — Consult Note (Signed)
GI consult cancelled per Dr Mack HookKallisetti: patient to be comfort care.  Vevelyn Pathristiane Yamel Bale, NP-C   Physical Exam:

## 2015-11-03 NOTE — Progress Notes (Signed)
[facility] [place] at [facility]   PATIENT NAME: Lee Lam    MR#:  [id number]  DATE OF BIRTH:  06-27-1923  SUBJECTIVE:  CHIEF COMPLAINT:   Chief Complaint  Patient presents with  . GI Bleeding   -Patient admitted with lower GI bleed. He is a ward of the state and also has dementia. -Has been having significant bloody bowel movements since morning. Received 1 unit packed RBC with drop in hemoglobin. -Hypertensive and critically ill appearing  REVIEW OF SYSTEMS:  Review of Systems  Unable to perform ROS: dementia    DRUG ALLERGIES:  No Known Allergies  VITALS:  Blood pressure 89/42, pulse 111, temperature 98.4 F (36.9 C), temperature source Axillary, resp. rate 20, weight 65.772 kg (145 lb), SpO2 100 %.  PHYSICAL EXAMINATION:  Physical Exam  GENERAL:  82 y.o.-year-old patient lying in the bed, Pale appearing. Critically ill. EYES: Pupils equal, round, reactive to light and accommodation. No scleral icterus. Extraocular muscles intact.  HEENT: Head atraumatic, normocephalic. Oropharynx and nasopharynx clear.  NECK:  Supple, no jugular venous distention. No thyroid enlargement, no tenderness.  LUNGS: Normal breath sounds bilaterally, no wheezing, rales,rhonchi or crepitation. No use of accessory muscles of respiration. Decreased bibasilar breath sounds CARDIOVASCULAR: S1, S2 normal. No  rubs, or gallops. 3/6 systolic murmur is present ABDOMEN: Soft, nontender, nondistended. Bowel sounds present. No organomegaly or mass.  EXTREMITIES: No  cyanosis, or clubbing. 1+ pedal edema NEUROLOGIC: Due to dementia, unable to cooperate for neuro exam. No facial droop or no obvious cranial nerve deficits Incomprehensible speech. Not following any commands. PSYCHIATRIC: The patient is alert but not oriented. SKIN: No obvious rash, lesion, or ulcer.    LABORATORY PANEL:   CBC  Recent Labs Lab 10/27/2015 2109  11/03/15 0746 11/03/15 1127  WBC 10.4  --    --   --   HGB 7.6*  < > 6.6* 6.4*  HCT 23.9*  --  20.2*  --   PLT 154  --   --   --   < > = values in this interval not displayed. ------------------------------------------------------------------------------------------------------------------  Chemistries   Recent Labs Lab 11/09/2015 2109 11/03/15 0746  NA 143 143  K 4.0 4.0  CL 112* 116*  CO2 24 22  GLUCOSE 149* 195*  BUN 31* 36*  CREATININE 0.57* 0.59*  CALCIUM 9.0 7.9*  AST 19  --   ALT 10*  --   ALKPHOS 58  --   BILITOT 0.2*  --    ------------------------------------------------------------------------------------------------------------------  Cardiac Enzymes No results for input(s): TROPONINI in the last 168 hours. ------------------------------------------------------------------------------------------------------------------  RADIOLOGY:  No results found.  EKG:   Orders placed or performed during the hospital encounter of 11/07/2015  . EKG 12-Lead  . EKG 12-Lead    ASSESSMENT AND PLAN:   82 year old male with past medical history significant for dementia, hypothyroidism, [facility] bleed again.  #1 GI bleed-likely lower GI bleed - stop Protonix drip, ongoing bleeding- not stable for bleeding scan - receiving 2nd unit of blood and IV fluid boluses - poor prognosis, underlying dementia and poor baseline  #2 Hemorrhagic shock- hypotensive, GI bleed -could be diverticular - Blood transfusions-  with not much response as actively bleeding - not stable for any procedures/bleeding scan  #3 Parkinsons - sinemet  #4 BPH- Flomax   Due to rapid deterioration of patients condition- discussed with Ms. Lazaro ArmsKay Flack from DSS and Ms. Eulogio DitchLatonia  Hall and they have decided that comfort care is appropriate at this time.  Will stop fluids and meds and change to comfort care.    All the records are reviewed and case discussed with Care Management/Social  Workerr. Management plans discussed with the patient, family and they are in agreement.  CODE STATUS: DO NOT RESUSCITATE  TOTAL TIME TAKING CARE OF THIS PATIENT: 45 minutes.     Enid Baas M.D on 11/03/2015 at 1:36 PM  Between 7am to 6pm - Pager - [phone]  After 6pm go to [email or web address] - password [email or web address]  [facility]  Office  [phone]  CC: Primary care physician; No primary care provider on file.

## 2015-11-03 NOTE — Progress Notes (Signed)
Notified Dr. Nemiah CommanderKalisetti of low BP. Per Dr. Nemiah CommanderKalisetti start 250mL bolus. Dr. Nemiah CommanderKalisetti will come up to see patient.

## 2015-11-05 LAB — TYPE AND SCREEN
ABO/RH(D): O POS
ANTIBODY SCREEN: NEGATIVE
UNIT DIVISION: 0
Unit division: 0
Unit division: 0

## 2015-11-05 LAB — PREPARE RBC (CROSSMATCH)

## 2015-11-13 NOTE — Progress Notes (Signed)
Patient began to decline in health.  Heart rate went down to 30 per central monitoring.  Patient's time of death was announced by charge nurse at 0004.  Patient was DNR and a ward of the state.  Legal guardian was notified and body was prepared to go to morgue.  Lee Lam, Lee Lam  Sep 29, 2015  3:39 AM

## 2015-11-13 NOTE — Discharge Summary (Signed)
[facility] [place] at [facility]    Death Note:  please see Last Note for all details.    Lee Lam CSN:[id number],MRN:[id number] is a 82 y.o. male, Outpatient Primary MD for the patient is No primary care provider on file.   In Brief:  [age] year old male with past medical history significant for dementia, hypothyroidism, [facility] brought in for GI bleed again. Patient was a DO NOT RESUSCITATE to begin with. He was bedbound and had poor functional status at baseline. He did not have any family. He was a ward of the state. In spite of blood transfusion, he started to become hypertensive, had active lower GI bleed. He was not stable for even a bleeding scan. He was not a good candidate for any kind of intervention. -He was on Protonix drip with ongoing blood and IV fluid boluses with worsening clinical condition. DSS was contacted and comfort care was discussed and they have agreed for the same. Patient was placed on comfort care with as needed comfort medicines only.  He was noted to be in asystole and was pronounced on 10/25/2015 at 12:04 AM  Final diagnosis:  1. Hemorrhagic shock 2. GI bleed 3. Acute on chronic anemia 4. Dementia 5. Parkinson's disease 6. Benign prostatitic hypertrophy 7. Depression 8. Hypertension 9. COPD 10. Hypothyroidism   Pronounced dead by RN, nursing supervisor on  10/13/2015    @  0004 AM                  Enid BaasKALISETTI,Henli Hey M.D on 10/15/2015 at 3:38 PM  [facility] [place] [facility]    OFFICE [phone]  Total clinical and documentation time for death summary - Under 30 minutes

## 2015-11-13 DEATH — deceased
